# Patient Record
Sex: Female | Born: 2000 | Race: Black or African American | Hispanic: No | Marital: Single
Health system: Southern US, Community
[De-identification: ages and names within clinical notes are randomized; demographics above are authoritative.]

## PROBLEM LIST (undated history)

## (undated) DIAGNOSIS — Z889 Allergy status to unspecified drugs, medicaments and biological substances status: Secondary | ICD-10-CM

## (undated) DIAGNOSIS — T7840XA Allergy, unspecified, initial encounter: Secondary | ICD-10-CM

## (undated) DIAGNOSIS — H539 Unspecified visual disturbance: Secondary | ICD-10-CM

---
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
## 2022-07-26 ENCOUNTER — Other Ambulatory Visit: Payer: Self-pay

## 2022-07-26 ENCOUNTER — Emergency Department (HOSPITAL_COMMUNITY)
Admission: EM | Admit: 2022-07-26 | Discharge: 2022-07-26 | Disposition: A | Discharge status: Home / Self Care | Payer: Medicaid Other | Attending: Emergency Medicine | Admitting: Emergency Medicine

## 2022-07-26 DIAGNOSIS — B3731 Acute candidiasis of vulva and vagina: Secondary | ICD-10-CM | POA: Insufficient documentation

## 2022-07-26 DIAGNOSIS — N898 Other specified noninflammatory disorders of vagina: Secondary | ICD-10-CM | POA: Diagnosis present

## 2022-07-26 LAB — WET PREP, GENITAL
Clue Cells Wet Prep HPF POC: NONE SEEN
Sperm: NONE SEEN
Trich, Wet Prep: NONE SEEN
WBC, Wet Prep HPF POC: >=10 — AB (ref <10)

## 2022-07-26 MED ORDER — FLUCONAZOLE 150 MG PO TABS: ORAL_TABLET | ORAL | 1 refills | Status: DC

## 2022-07-26 NOTE — ED Triage Notes (Signed)
Pt reports darkred/brown vaginal discharge x 2-3 days "I have irritation down there but it also itches." Requests std check

## 2022-07-27 LAB — GC/CHLAMYDIA PROBE AMP (~~LOC~~) NOT AT ARMC
Chlamydia: NEGATIVE
Comment: NEGATIVE
Comment: NORMAL
Neisseria Gonorrhea: NEGATIVE

## 2022-07-27 NOTE — ED Provider Notes (Signed)
[place] EMERGENCY DEPARTMENT AT [facility] Provider Note   CSN: [id number] Arrival date & time: 07/26/22  1616     History  Chief Complaint  Patient presents with   Vaginal Discharge    Angie Daniels is a 22 y.o. female.  The history is provided by the patient. No language interpreter was used.  Vaginal Discharge Quality:  Manson Passey Severity:  Mild Onset quality:  Gradual Duration:  3 days Timing:  Constant Progression:  Worsening Chronicity:  New Relieved by:  Nothing Worsened by:  Nothing Ineffective treatments:  None tried Risk factors: no new sexual partner and no STI exposure        Home Medications Prior to Admission medications   Medication Sig Start Date End Date Taking? Authorizing Provider  fluconazole (DIFLUCAN) 150 MG tablet One tablet now.  May refill and repeat in 1 week if symptoms persit 07/26/22  Yes Elson Areas, PA-C  cephALEXin (KEFLEX) 500 MG capsule Take 1 capsule (500 mg total) by mouth 2 (two) times daily. 10/18/21   Wallis Bamberg, PA-C  Cholecalciferol (VITAMIN D PO) Take by mouth.    [provider]  fluticasone (FLONASE) 50 MCG/ACT nasal spray Place 1 spray into the nose daily.    [provider]  levocetirizine (XYZAL) 2.5 MG/5ML solution Take 10 mLs (5 mg total) by mouth every evening for 14 days. 06/10/17 06/24/17  Cruz, Greggory Brandy C, DO  naproxen (NAPROSYN) 375 MG tablet Take 1 tablet (375 mg total) by mouth 2 (two) times daily with a meal. 10/18/21   Wallis Bamberg, PA-C  tiZANidine (ZANAFLEX) 4 MG tablet Take 1 tablet (4 mg total) by mouth at bedtime. 10/18/21   Wallis Bamberg, PA-C      Allergies    Patient has no known allergies.    Review of Systems   Review of Systems  Genitourinary:  Positive for vaginal discharge.  All other systems reviewed and are negative.   Physical Exam Updated Vital Signs BP 121/81   Pulse 84   Temp 98.3 F (36.8 C) (Oral)   Resp 18   Ht 5\' 5"  (1.651 m)   Wt 57 kg   LMP 07/18/2022    SpO2 100%   BMI 20.91 kg/m  Physical Exam Vitals and nursing note reviewed.  Constitutional:      General: She is not in acute distress.    Appearance: She is well-developed.  HENT:     Head: Normocephalic and atraumatic.  Eyes:     Conjunctiva/sclera: Conjunctivae normal.  Cardiovascular:     Rate and Rhythm: Normal rate and regular rhythm.     Heart sounds: No murmur heard. Pulmonary:     Effort: Pulmonary effort is normal. No respiratory distress.     Breath sounds: Normal breath sounds.  Abdominal:     Palpations: Abdomen is soft.     Tenderness: There is no abdominal tenderness.  Genitourinary:    Vagina: Vaginal discharge present.  Musculoskeletal:        General: No swelling.  Skin:    General: Skin is warm and dry.     Capillary Refill: Capillary refill takes less than 2 seconds.  Neurological:     Mental Status: She is alert.  Psychiatric:        Mood and Affect: Mood normal.     ED Results / Procedures / Treatments   Labs (all labs ordered are listed, but only abnormal results are displayed) Labs Reviewed  WET PREP, GENITAL - Abnormal;  Notable for the following components:      Result Value   Yeast Wet Prep HPF POC PRESENT (*)    WBC, Wet Prep HPF POC >=10 (*)    All other components within normal limits  GC/CHLAMYDIA PROBE AMP ([place]) NOT AT [facility]    EKG None  Radiology No results found.  Procedures Procedures    Medications Ordered in ED Medications - No data to display  ED Course/ Medical Decision Making/ A&P                             Medical Decision Making Pt complains of a vaginal discharge  Amount and/or Complexity of Data Reviewed Labs: ordered. Decision-making details documented in ED Course.    Details: Labs ordered reviewed and interpreted  wet prep shows yeast   Risk Prescription drug management. Risk Details: Pt counseled on yeast  rx for diflucan           Final Clinical Impression(s) / ED  Diagnoses Final diagnoses:  Yeast vaginitis    Rx / DC Orders ED Discharge Orders          Ordered    fluconazole (DIFLUCAN) 150 MG tablet        07/26/22 1917           An After Visit Summary was printed and given to the patient.    Osie Cheeks 07/27/22 2029    Linwood Dibbles, MD 07/30/22 [phone]

## 2022-08-22 ENCOUNTER — Telehealth: Payer: Medicaid Other | Admitting: Family

## 2022-08-22 DIAGNOSIS — N898 Other specified noninflammatory disorders of vagina: Secondary | ICD-10-CM

## 2022-08-22 NOTE — Progress Notes (Signed)
You need to be seen in person for further testing.   NOTE: There will be NO CHARGE for this eVisit   If you are having a true medical emergency please call 911.      For an urgent face to face visit, [place] has eight urgent care centers for your convenience:   NEW!! [facility] Health Urgent Care Center at [facility] Get Driving Directions [phone] [address], [address] [place], [address]    [facility] Health Urgent Care Center at [facility] Get Driving Directions [phone] [address] [place], [place] [address]   [facility]gent Care Center [facility]) Get Driving Directions [phone] [address] [place], [place] [address]  [facility] Health Urgent Care Center [facility]) Get Driving Directions [phone] [address] [place],  [place]  [address]  [facility] Health Urgent Care Center [facility] - at [employer]  [phone] [address] [place],  [place] [address]   [facility] Health Urgent Care at [facility] Get Driving Directions [phone] [address] [place], [place] [address]   [facility] Health Urgent Care at [facility] Get Driving Directions  [phone] [address] [place], [place] [address]   [facility] Health Urgent Care at [facility] Directions [phone] [address] [place], [place] [address]  Your MyChart E-visit questionnaire answers were reviewed by a board certified advanced clinical practitioner to complete your personal care plan based on your specific symptoms.  Thank you for using e-Visits.

## 2022-08-30 ENCOUNTER — Ambulatory Visit: Payer: Medicaid Other | Admitting: Occupational Medicine

## 2022-11-02 ENCOUNTER — Encounter: Payer: Medicaid Other | Admitting: Obstetrics and Gynecology

## 2022-12-03 ENCOUNTER — Emergency Department
Admission: EM | Admit: 2022-12-03 | Discharge: 2022-12-03 | Disposition: A | Discharge status: Home / Self Care | Payer: Medicaid Other | Attending: Emergency Medicine | Admitting: Emergency Medicine

## 2022-12-03 ENCOUNTER — Other Ambulatory Visit: Payer: Self-pay

## 2022-12-03 ENCOUNTER — Emergency Department: Payer: Medicaid Other

## 2022-12-03 DIAGNOSIS — M79672 Pain in left foot: Secondary | ICD-10-CM | POA: Diagnosis present

## 2022-12-03 DIAGNOSIS — Y92410 Unspecified street and highway as the place of occurrence of the external cause: Secondary | ICD-10-CM | POA: Insufficient documentation

## 2022-12-03 DIAGNOSIS — M79671 Pain in right foot: Secondary | ICD-10-CM | POA: Insufficient documentation

## 2022-12-03 DIAGNOSIS — J45909 Unspecified asthma, uncomplicated: Secondary | ICD-10-CM | POA: Insufficient documentation

## 2022-12-03 MED ORDER — ACETAMINOPHEN 325 MG PO TABS
650.0000 mg | ORAL_TABLET | Freq: Once | ORAL | Status: AC
  Administered 2022-12-03: 650 mg via ORAL
  Filled 2022-12-03: qty 2

## 2022-12-03 NOTE — Discharge Instructions (Signed)
Your x-rays were normal.  You may continue to take Tylenol/ibuprofen per package instructions to help with your pain.  Please return for any new, worsening, or change in symptoms or other concerns.  It was a pleasure caring for you today.
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
## 2022-12-07 ENCOUNTER — Ambulatory Visit (INDEPENDENT_AMBULATORY_CARE_PROVIDER_SITE_OTHER): Payer: Self-pay

## 2022-12-07 ENCOUNTER — Encounter (HOSPITAL_COMMUNITY): Payer: Self-pay

## 2022-12-07 ENCOUNTER — Ambulatory Visit (HOSPITAL_COMMUNITY)
Admission: EM | Admit: 2022-12-07 | Discharge: 2022-12-07 | Disposition: A | Discharge status: Home / Self Care | Payer: Self-pay | Attending: Nurse Practitioner | Admitting: Nurse Practitioner

## 2022-12-07 DIAGNOSIS — M546 Pain in thoracic spine: Secondary | ICD-10-CM

## 2022-12-07 DIAGNOSIS — S93401A Sprain of unspecified ligament of right ankle, initial encounter: Secondary | ICD-10-CM

## 2022-12-07 DIAGNOSIS — Z3202 Encounter for pregnancy test, result negative: Secondary | ICD-10-CM

## 2022-12-07 LAB — POCT URINE PREGNANCY: Preg Test, Ur: NEGATIVE

## 2022-12-07 MED ORDER — IBUPROFEN 800 MG PO TABS: 800.0000 mg | ORAL_TABLET | Freq: Three times a day (TID) | ORAL | 0 refills | Status: DC | PRN

## 2022-12-07 MED ORDER — CYCLOBENZAPRINE HCL 5 MG PO TABS: 5.0000 mg | ORAL_TABLET | Freq: Three times a day (TID) | ORAL | 0 refills | Status: DC | PRN
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
## 2022-12-07 NOTE — Discharge Instructions (Addendum)
Please wear the walking boot for the next couple of weeks you are walking to help provide support for your ankle.  You can also use an Ace wrap to help provide stability and help your ankle get better.  I will contact you if the ankle x-ray shows anything else that we need to treat differently for.  In the meantime, continue Tylenol 500 to 1000 mg every 6 hours alternating with ibuprofen 800 mg every 8 hours for pain.  You can take the muscle relaxant every 8 hours as needed to help as well.  Be careful because this medication can make you drowsy, so do not take prior to operating or driving heavy machinery.  The pain in your ankle does not improve with this treatment, recommend follow-up with podiatry; contact information has been provided.
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
## 2022-12-31 ENCOUNTER — Ambulatory Visit: Payer: Medicaid Other | Admitting: Podiatry

## 2023-01-11 ENCOUNTER — Other Ambulatory Visit: Payer: Self-pay

## 2023-01-12 ENCOUNTER — Encounter: Payer: Medicaid Other | Admitting: Obstetrics and Gynecology

## 2023-05-03 ENCOUNTER — Encounter: Payer: Medicaid Other | Admitting: Obstetrics and Gynecology

## 2023-08-12 ENCOUNTER — Encounter: Payer: Self-pay | Admitting: Nurse Practitioner

## 2023-08-12 ENCOUNTER — Other Ambulatory Visit (HOSPITAL_COMMUNITY)
Admission: RE | Admit: 2023-08-12 | Discharge: 2023-08-12 | Disposition: A | Discharge status: Home / Self Care | Source: Ambulatory Visit | Attending: Nurse Practitioner | Admitting: Nurse Practitioner

## 2023-08-12 ENCOUNTER — Ambulatory Visit: Payer: Self-pay | Admitting: Nurse Practitioner

## 2023-08-12 ENCOUNTER — Ambulatory Visit: Admitting: Nurse Practitioner

## 2023-08-12 VITALS — BP 124/88 | HR 52 | Temp 97.4°F | Ht 65.0 in | Wt 136.2 lb

## 2023-08-12 DIAGNOSIS — Z136 Encounter for screening for cardiovascular disorders: Secondary | ICD-10-CM

## 2023-08-12 DIAGNOSIS — Z113 Encounter for screening for infections with a predominantly sexual mode of transmission: Secondary | ICD-10-CM

## 2023-08-12 DIAGNOSIS — J302 Other seasonal allergic rhinitis: Secondary | ICD-10-CM | POA: Diagnosis not present

## 2023-08-12 DIAGNOSIS — Z3009 Encounter for other general counseling and advice on contraception: Secondary | ICD-10-CM

## 2023-08-12 DIAGNOSIS — F419 Anxiety disorder, unspecified: Secondary | ICD-10-CM

## 2023-08-12 DIAGNOSIS — J029 Acute pharyngitis, unspecified: Secondary | ICD-10-CM

## 2023-08-12 DIAGNOSIS — J4520 Mild intermittent asthma, uncomplicated: Secondary | ICD-10-CM

## 2023-08-12 LAB — LIPID PANEL
Cholesterol: 127 mg/dL (ref 0–200)
HDL: 51 mg/dL (ref >39.00)
LDL Cholesterol: 66 mg/dL (ref 0–99)
NonHDL: 76.36
Total CHOL/HDL Ratio: 2
Triglycerides: 52 mg/dL (ref 0.0–149.0)
VLDL: 10.4 mg/dL (ref 0.0–40.0)

## 2023-08-12 LAB — CBC WITH DIFFERENTIAL/PLATELET
Basophils Absolute: 0 10*3/uL (ref 0.0–0.1)
Basophils Relative: 0.7 % (ref 0.0–3.0)
Eosinophils Absolute: 0.1 10*3/uL (ref 0.0–0.7)
Eosinophils Relative: 3.4 % (ref 0.0–5.0)
HCT: 39 % (ref 36.0–46.0)
Hemoglobin: 13 g/dL (ref 12.0–15.0)
Lymphocytes Relative: 39.5 % (ref 12.0–46.0)
Lymphs Abs: 1.5 10*3/uL (ref 0.7–4.0)
MCHC: 33.4 g/dL (ref 30.0–36.0)
MCV: 92.1 fl (ref 78.0–100.0)
Monocytes Absolute: 0.3 10*3/uL (ref 0.1–1.0)
Monocytes Relative: 9.2 % (ref 3.0–12.0)
Neutro Abs: 1.8 10*3/uL (ref 1.4–7.7)
Neutrophils Relative %: 47.2 % (ref 43.0–77.0)
Platelets: 450 10*3/uL — ABNORMAL HIGH (ref 150.0–400.0)
RBC: 4.23 Mil/uL (ref 3.87–5.11)
RDW: 13.5 % (ref 11.5–14.6)
WBC: 3.8 10*3/uL — ABNORMAL LOW (ref 4.5–10.5)

## 2023-08-12 LAB — POCT RAPID STREP A (OFFICE): Rapid Strep A Screen: NEGATIVE

## 2023-08-12 LAB — COMPREHENSIVE METABOLIC PANEL WITH GFR
ALT: 18 U/L (ref 0–35)
AST: 15 U/L (ref 0–37)
Albumin: 4.4 g/dL (ref 3.5–5.2)
Alkaline Phosphatase: 51 U/L (ref 39–117)
BUN: 11 mg/dL (ref 6–23)
CO2: 28 meq/L (ref 19–32)
Calcium: 9.7 mg/dL (ref 8.4–10.5)
Chloride: 101 meq/L (ref 96–112)
Creatinine, Ser: 0.75 mg/dL (ref 0.40–1.20)
GFR: 114.34 mL/min (ref >60.00)
Glucose, Bld: 92 mg/dL (ref 70–99)
Potassium: 3.7 meq/L (ref 3.5–5.1)
Sodium: 138 meq/L (ref 135–145)
Total Bilirubin: 0.5 mg/dL (ref 0.2–1.2)
Total Protein: 7.9 g/dL (ref 6.0–8.3)

## 2023-08-12 LAB — TSH: TSH: 1.11 u[IU]/mL (ref 0.35–5.50)

## 2023-08-12 MED ORDER — CETIRIZINE HCL 10 MG PO TABS: 10.0000 mg | ORAL_TABLET | Freq: Every day | ORAL | 3 refills | Status: AC

## 2023-08-12 MED ORDER — NORELGESTROMIN-ETH ESTRADIOL 150-35 MCG/24HR TD PTWK: 1.0000 | MEDICATED_PATCH | TRANSDERMAL | 12 refills | Status: AC

## 2023-08-12 NOTE — Patient Instructions (Signed)
 It was great to see you!  Start the birth control patch once a week. Take off the old patch before putting on a new one. You will do this once a week for 3 weeks, then leave it off for a week  We are checking your labs today and will let you know the results via mychart/phone.   Start cetirizine  1 tablet daily for your allergies  Let's follow-up in 3 months, sooner if you have concerns.  If a referral was placed today, you will be contacted for an appointment. Please note that routine referrals can sometimes take up to 3-4 weeks to process. Please call our office if you haven't heard anything after this time frame.  Take care,  Rheba Cedar, NP

## 2023-08-12 NOTE — Assessment & Plan Note (Signed)
 She has a history of asthma when she was a child, but symptoms have not recurred in the past few years.

## 2023-08-12 NOTE — Progress Notes (Signed)
 New Patient Visit  BP 124/88 (BP Location: Left Arm, Patient Position: Sitting, Cuff Size: Small)   Pulse (!) 52   Temp (!) 97.4 F (36.3 C)   Ht 5\' 5"  (1.651 m)   Wt 136 lb 3.2 oz (61.8 kg)   LMP 08/11/2023 (Exact Date)   SpO2 100%   BMI 22.66 kg/m    Subjective:    Patient ID: Angie Daniels, female    DOB: 12-Mar-2001, 22 y.o.   MRN: [id number]  CC: Chief Complaint  Patient presents with   Establish Care    NP. Est. Care, request to get a PAP and STD testing, wants to discuss birth control options, Rx of Cetrizine    HPI: Angie Daniels is a 23 y.o. female presents for new patient visit to establish care.  Introduced to Publishing rights manager role and practice setting.  All questions answered.  Discussed provider/patient relationship and expectations.  Discussed the use of AI scribe software for clinical note transcription with the patient, who gave verbal consent to proceed.  History of Present Illness   Angie Daniels is a 23 year old female who presents for birth control consultation and allergy management.   She is sexually active and not using any birth control currently, however would like to discuss options today.   She experiences persistent allergy symptoms, including a sore throat for nine days, intermittent severe pain, coughing, and mild shortness of breath. She has run out of her cetirizine . She denies chest pain or tightness. She has asthma from when she was younger, but this has not been bothersome in recent years.   She experiences anxiety and trouble sleeping, which do not interfere with daily activities, and sometimes has panic attacks. She denies depression. She is active with her friends and family and works at [employer].        08/12/2023    9:06 AM  Depression screen PHQ 2/9  Decreased Interest 2  Down, Depressed, Hopeless 1  PHQ - 2 Score 3  Altered sleeping 1  Tired, decreased energy 2  Change in appetite 0  Feeling bad or failure  about yourself  1  Trouble concentrating 2  Moving slowly or fidgety/restless 2  Suicidal thoughts 2  PHQ-9 Score 13  Difficult doing work/chores Somewhat difficult      08/12/2023    9:06 AM  GAD 7 : Generalized Anxiety Score  Nervous, Anxious, on Edge 2  Control/stop worrying 2  Worry too much - different things 2  Trouble relaxing 2  Restless 2  Easily annoyed or irritable 2  Afraid - awful might happen 2  Total GAD 7 Score 14  Anxiety Difficulty Somewhat difficult    Past Medical History:  Diagnosis Date   Allergy    seasonal   Asthma    Multiple allergies    wears glasses     Past Surgical History:  Procedure Laterality Date   TONSILLECTOMY AND ADENOIDECTOMY Bilateral 02/23/2016   Procedure: TONSILLECTOMY AND ADENOIDECTOMY;  Surgeon: Lenton Rail, MD;  Location: [facility];  Service: ENT;  Laterality: Bilateral;    Family History  Problem Relation Age of Onset   Thyroid disease Mother    Asthma Maternal Grandmother    Hyperlipidemia Maternal Grandmother    Learning disabilities Maternal Grandmother    Mental illness Maternal Grandmother    Asthma Maternal Grandfather      Social History   Tobacco Use   Smoking status: Never  Passive exposure: Yes   Smokeless tobacco: Never   Tobacco comments:    Vaping  Vaping Use   Vaping status: Some Days  Substance Use Topics   Alcohol use: No   Drug use: No    No current outpatient medications on file prior to visit.   No current facility-administered medications on file prior to visit.     Review of Systems  Constitutional:  Positive for fatigue. Negative for fever.  HENT:  Positive for congestion and sore throat.   Respiratory:  Positive for cough. Negative for shortness of breath.   Cardiovascular: Negative.   Gastrointestinal: Negative.   Genitourinary: Negative.   Musculoskeletal: Negative.   Skin: Negative.   Neurological: Negative.   Psychiatric/Behavioral:  Positive for  sleep disturbance. Negative for dysphoric mood. The patient is nervous/anxious.       Objective:     BP 124/88 (BP Location: Left Arm, Patient Position: Sitting, Cuff Size: Small)   Pulse (!) 52   Temp (!) 97.4 F (36.3 C)   Ht 5\' 5"  (1.651 m)   Wt 136 lb 3.2 oz (61.8 kg)   LMP 08/11/2023 (Exact Date)   SpO2 100%   BMI 22.66 kg/m   Wt Readings from Last 3 Encounters:  08/12/23 136 lb 3.2 oz (61.8 kg)  12/07/22 135 lb (61.2 kg)  12/03/22 125 lb 10.6 oz (57 kg)    BP Readings from Last 3 Encounters:  08/12/23 124/88  12/07/22 119/81  12/03/22 120/72    Physical Exam Vitals and nursing note reviewed.  Constitutional:      General: She is not in acute distress.    Appearance: Normal appearance.  HENT:     Head: Normocephalic and atraumatic.     Right Ear: Tympanic membrane, ear canal and external ear normal.     Left Ear: Tympanic membrane, ear canal and external ear normal.     Nose: Congestion present.     Mouth/Throat:     Mouth: Mucous membranes are moist.     Pharynx: Posterior oropharyngeal erythema present.  Eyes:     Conjunctiva/sclera: Conjunctivae normal.  Cardiovascular:     Rate and Rhythm: Normal rate and regular rhythm.     Pulses: Normal pulses.     Heart sounds: Normal heart sounds.  Pulmonary:     Effort: Pulmonary effort is normal.     Breath sounds: Normal breath sounds.  Abdominal:     Palpations: Abdomen is soft.     Tenderness: There is no abdominal tenderness.  Musculoskeletal:        General: Normal range of motion.     Cervical back: Normal range of motion and neck supple.     Right lower leg: No edema.     Left lower leg: No edema.  Lymphadenopathy:     Cervical: No cervical adenopathy.  Skin:    General: Skin is warm and dry.  Neurological:     General: No focal deficit present.     Mental Status: She is alert and oriented to person, place, and time.     Cranial Nerves: No cranial nerve deficit.     Coordination: Coordination  normal.     Gait: Gait normal.  Psychiatric:        Mood and Affect: Mood normal.        Behavior: Behavior normal.        Thought Content: Thought content normal.        Judgment: Judgment normal.  Assessment & Plan:   Problem List Items Addressed This Visit       Respiratory   Asthma   She has a history of asthma when she was a child, but symptoms have not recurred in the past few years.         Other   Anxiety   She experiences intermittent anxiety with occasional panic attacks, which do not interfere with daily activities. Check CBC, TSH today.      Relevant Orders   Comprehensive metabolic panel with GFR   CBC with Differential/Platelet   TSH   Birth control counseling   Contraceptive options were discussed. She prefers the patch due to concerns about daily pills and injections. Initiate contraceptive patch. Advise starting on the first day of the next period for immediate coverage or use backup contraception for two weeks if starting mid-cycle. Educate on patch application and schedule.       Other Visit Diagnoses       Sore throat    -  Primary   Strep test negative, most likely related to allergies. See plan below.   Relevant Orders   POCT rapid strep A (Completed)     Seasonal allergies       Will restart cetirizine  10mg  daily. Follow-up if symptoms worsen or don't improve.     Screen for STD (sexually transmitted disease)       Screen STD per requst, no current symptoms   Relevant Orders   HIV Antibody (routine testing w rflx)   RPR   HSV(herpes simplex vrs) 1+2 ab-IgG   Hepatitis C antibody   Urine cytology ancillary only     Screening for cardiovascular condition       Screen baseline lipid panel today   Relevant Orders   Lipid panel        Follow up plan: Return in about 3 months (around 11/12/2023) for CPE.  Marylen Zuk A Lajoya Dombek

## 2023-08-12 NOTE — Assessment & Plan Note (Signed)
 Contraceptive options were discussed. She prefers the patch due to concerns about daily pills and injections. Initiate contraceptive patch. Advise starting on the first day of the next period for immediate coverage or use backup contraception for two weeks if starting mid-cycle. Educate on patch application and schedule.

## 2023-08-12 NOTE — Assessment & Plan Note (Signed)
 She experiences intermittent anxiety with occasional panic attacks, which do not interfere with daily activities. Check CBC, TSH today.

## 2023-08-15 LAB — URINE CYTOLOGY ANCILLARY ONLY
Chlamydia: NEGATIVE
Comment: NEGATIVE
Comment: NORMAL
Neisseria Gonorrhea: NEGATIVE

## 2023-08-16 LAB — RPR: RPR Ser Ql: NONREACTIVE

## 2023-08-16 LAB — HEPATITIS C ANTIBODY: Hepatitis C Ab: NONREACTIVE

## 2023-08-16 LAB — HSV(HERPES SIMPLEX VRS) I + II AB-IGG
HSV 1 IGG,TYPE SPECIFIC AB: <0.9 {index}
HSV 2 IGG,TYPE SPECIFIC AB: <0.9 {index}

## 2023-08-16 LAB — HIV ANTIBODY (ROUTINE TESTING W REFLEX): HIV 1&2 Ab, 4th Generation: NONREACTIVE

## 2023-11-15 ENCOUNTER — Encounter: Payer: Self-pay | Admitting: Nurse Practitioner

## 2023-11-15 ENCOUNTER — Encounter: Admitting: Nurse Practitioner

## 2023-12-15 ENCOUNTER — Encounter: Admitting: Nurse Practitioner

## 2023-12-23 ENCOUNTER — Encounter: Admitting: Nurse Practitioner

## 2023-12-30 ENCOUNTER — Telehealth: Payer: Self-pay | Admitting: Nurse Practitioner

## 2023-12-30 NOTE — Telephone Encounter (Signed)
 Noted

## 2023-12-30 NOTE — Telephone Encounter (Signed)
 11/15/2023 no show 12/15/2023 no show 12/23/2023 no show  Final warning sent via mail and mychart.
